# Patient Record
Sex: Female | Born: 1940 | Race: White | Hispanic: No | Marital: Married | Smoking: Current some day smoker
Health system: Southern US, Community
[De-identification: ages and names within clinical notes are randomized; demographics above are authoritative.]

## PROBLEM LIST (undated history)

## (undated) DIAGNOSIS — M1990 Unspecified osteoarthritis, unspecified site: Secondary | ICD-10-CM

## (undated) DIAGNOSIS — IMO0002 Reserved for concepts with insufficient information to code with codable children: Secondary | ICD-10-CM

## (undated) DIAGNOSIS — K589 Irritable bowel syndrome without diarrhea: Secondary | ICD-10-CM

## (undated) DIAGNOSIS — G2581 Restless legs syndrome: Secondary | ICD-10-CM

## (undated) DIAGNOSIS — J449 Chronic obstructive pulmonary disease, unspecified: Secondary | ICD-10-CM

## (undated) DIAGNOSIS — M797 Fibromyalgia: Secondary | ICD-10-CM

## (undated) DIAGNOSIS — M419 Scoliosis, unspecified: Secondary | ICD-10-CM

## (undated) DIAGNOSIS — I10 Essential (primary) hypertension: Secondary | ICD-10-CM

## (undated) HISTORY — PX: TOTAL SHOULDER REPLACEMENT: SUR1217

## (undated) HISTORY — DX: Essential (primary) hypertension: I10

## (undated) HISTORY — DX: Fibromyalgia: M79.7

## (undated) HISTORY — DX: Scoliosis, unspecified: M41.9

## (undated) HISTORY — PX: BACK SURGERY: SHX140

## (undated) HISTORY — DX: Chronic obstructive pulmonary disease, unspecified: J44.9

## (undated) HISTORY — DX: Unspecified osteoarthritis, unspecified site: M19.90

## (undated) HISTORY — DX: Irritable bowel syndrome without diarrhea: K58.9

## (undated) HISTORY — PX: CHOLECYSTECTOMY: SHX55

## (undated) HISTORY — DX: Restless legs syndrome: G25.81

## (undated) HISTORY — PX: REPLACEMENT TOTAL KNEE: SUR1224

## (undated) HISTORY — DX: Reserved for concepts with insufficient information to code with codable children: IMO0002

---

## 2003-07-08 ENCOUNTER — Ambulatory Visit (HOSPITAL_COMMUNITY): Admission: RE | Admit: 2003-07-08 | Discharge: 2003-07-08 | Payer: Self-pay | Admitting: Family Medicine

## 2003-09-02 ENCOUNTER — Ambulatory Visit (HOSPITAL_COMMUNITY): Admission: RE | Admit: 2003-09-02 | Discharge: 2003-09-02 | Payer: Self-pay | Admitting: Internal Medicine

## 2003-09-05 ENCOUNTER — Ambulatory Visit (HOSPITAL_COMMUNITY): Admission: RE | Admit: 2003-09-05 | Discharge: 2003-09-05 | Payer: Self-pay | Admitting: Internal Medicine

## 2003-10-22 ENCOUNTER — Ambulatory Visit (HOSPITAL_COMMUNITY): Admission: RE | Admit: 2003-10-22 | Discharge: 2003-10-22 | Payer: Self-pay | Admitting: Family Medicine

## 2004-10-26 ENCOUNTER — Emergency Department (HOSPITAL_COMMUNITY): Admission: EM | Admit: 2004-10-26 | Discharge: 2004-10-26 | Payer: Self-pay | Admitting: Emergency Medicine

## 2006-07-13 ENCOUNTER — Ambulatory Visit: Payer: Self-pay | Admitting: Orthopedic Surgery

## 2006-08-02 DIAGNOSIS — Z8679 Personal history of other diseases of the circulatory system: Secondary | ICD-10-CM | POA: Insufficient documentation

## 2006-08-16 ENCOUNTER — Inpatient Hospital Stay (HOSPITAL_COMMUNITY): Admission: RE | Admit: 2006-08-16 | Discharge: 2006-08-20 | Payer: Self-pay | Admitting: Orthopedic Surgery

## 2006-12-06 ENCOUNTER — Inpatient Hospital Stay (HOSPITAL_COMMUNITY): Admission: RE | Admit: 2006-12-06 | Discharge: 2006-12-08 | Payer: Self-pay | Admitting: Orthopedic Surgery

## 2007-06-14 ENCOUNTER — Encounter: Admission: RE | Admit: 2007-06-14 | Discharge: 2007-06-14 | Payer: Self-pay | Admitting: Orthopedic Surgery

## 2008-06-14 IMAGING — CR DG KNEE 1-2V PORT*L*
2 series · 2 of 2 positions shown · non-contrast
Comparison: Preoperative, 7667.

Left knee, 2 views.
CLINICAL DATA: Knee replacement.
TECHNIQUE: AP and lateral portable left knee radiographs.

[view not recorded (1 of 2)]
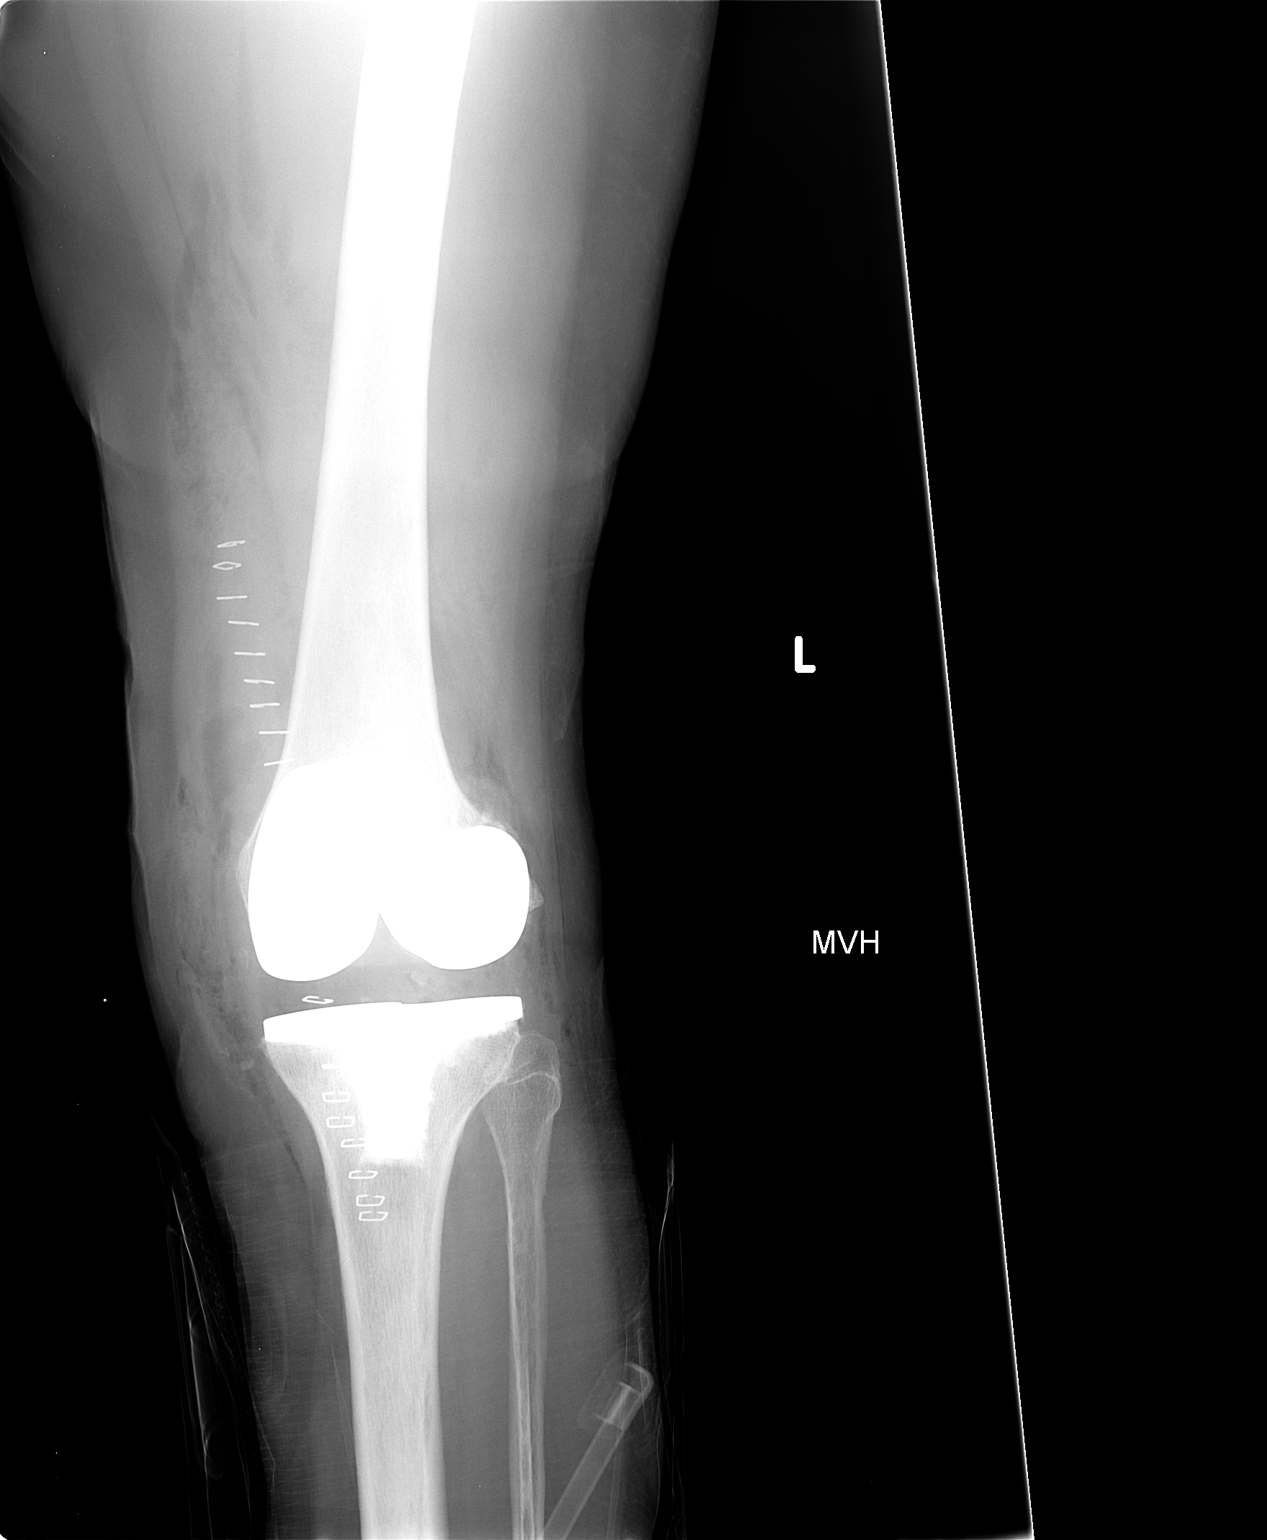

[view not recorded (2 of 2)]
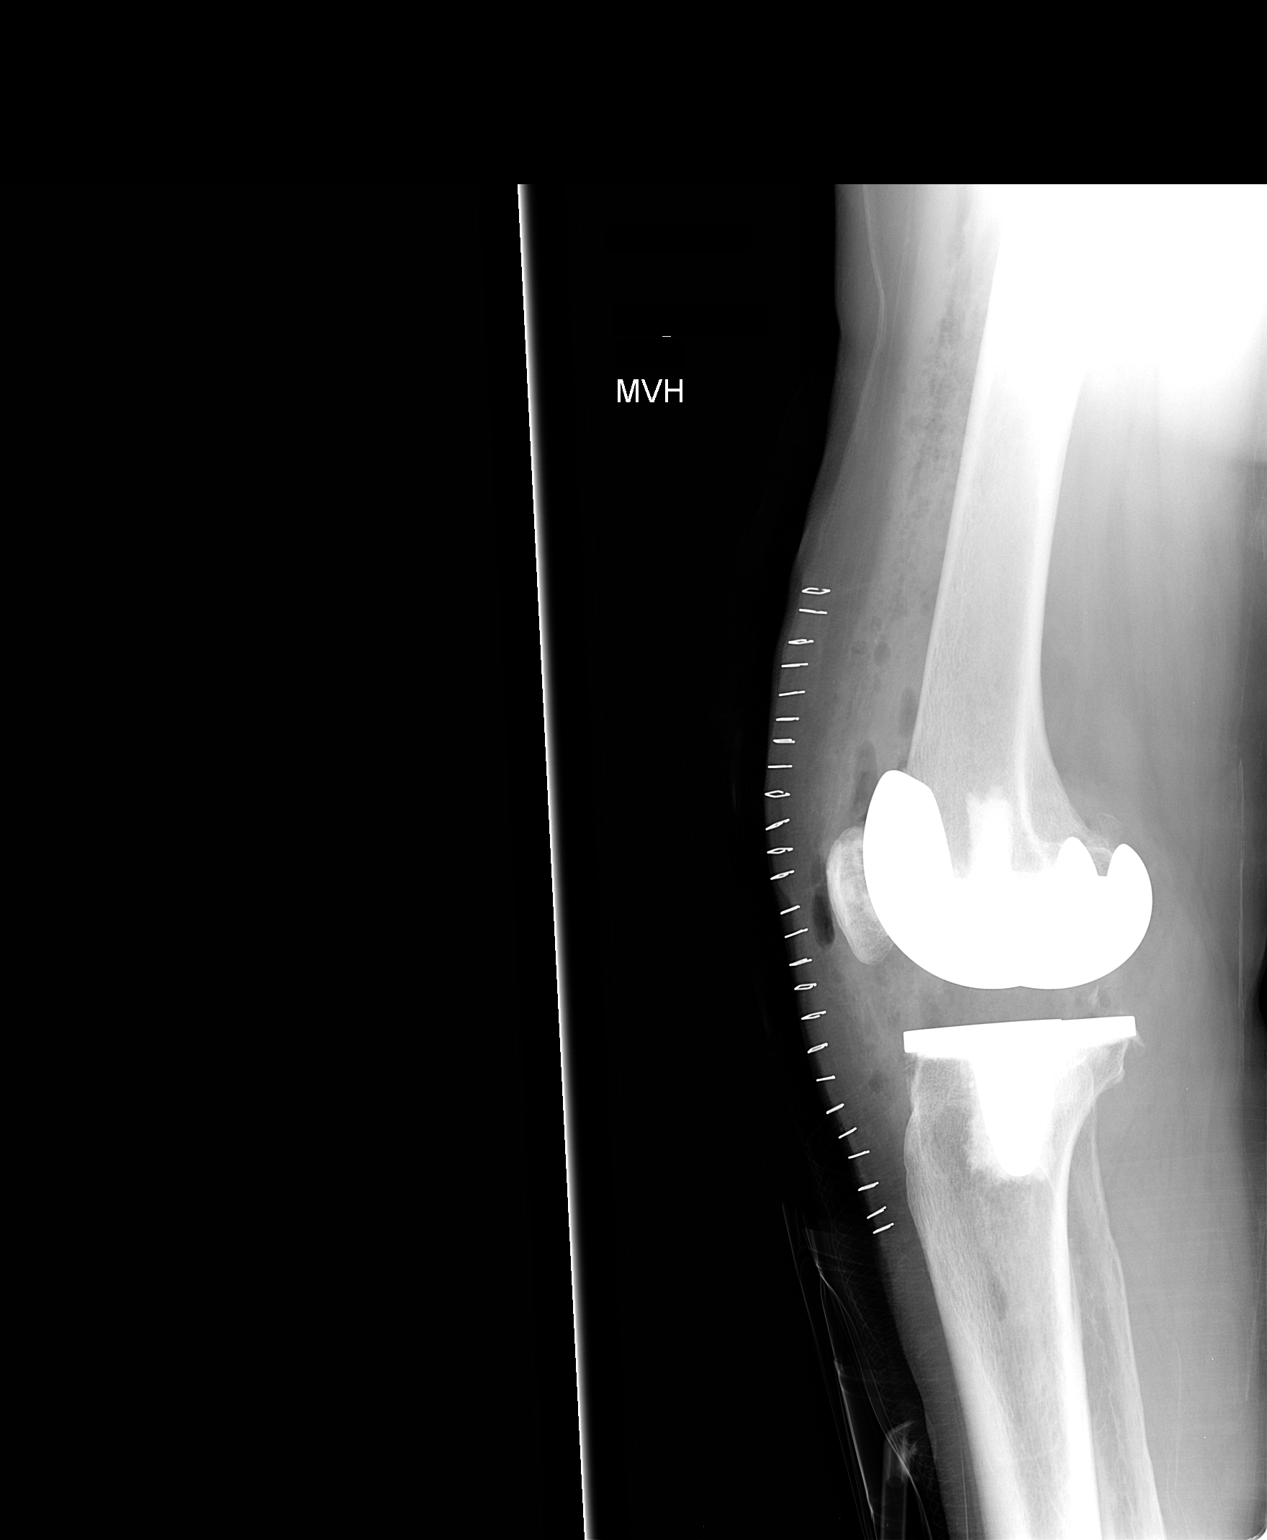

[2 of 2 positions shown; findings below may reference images not displayed]

FINDINGS: New three-part left total knee arthroplasty. No complication.
Anatomic alignment. Gas in soft tissues, as expected.
IMPRESSION: Uncomplicated new left three-part total knee arthroplasty.

## 2008-10-04 IMAGING — CR DG SHOULDER 2+V PORT*R*
1 series · 1 of 1 positions shown · non-contrast
Comparison: none

CLINICAL DATA: Right shoulder osteoarthritis - hemiarthroplasty. 
 PORTABLE RIGHT SHOULDER - 12/06/2006:

[view not recorded]
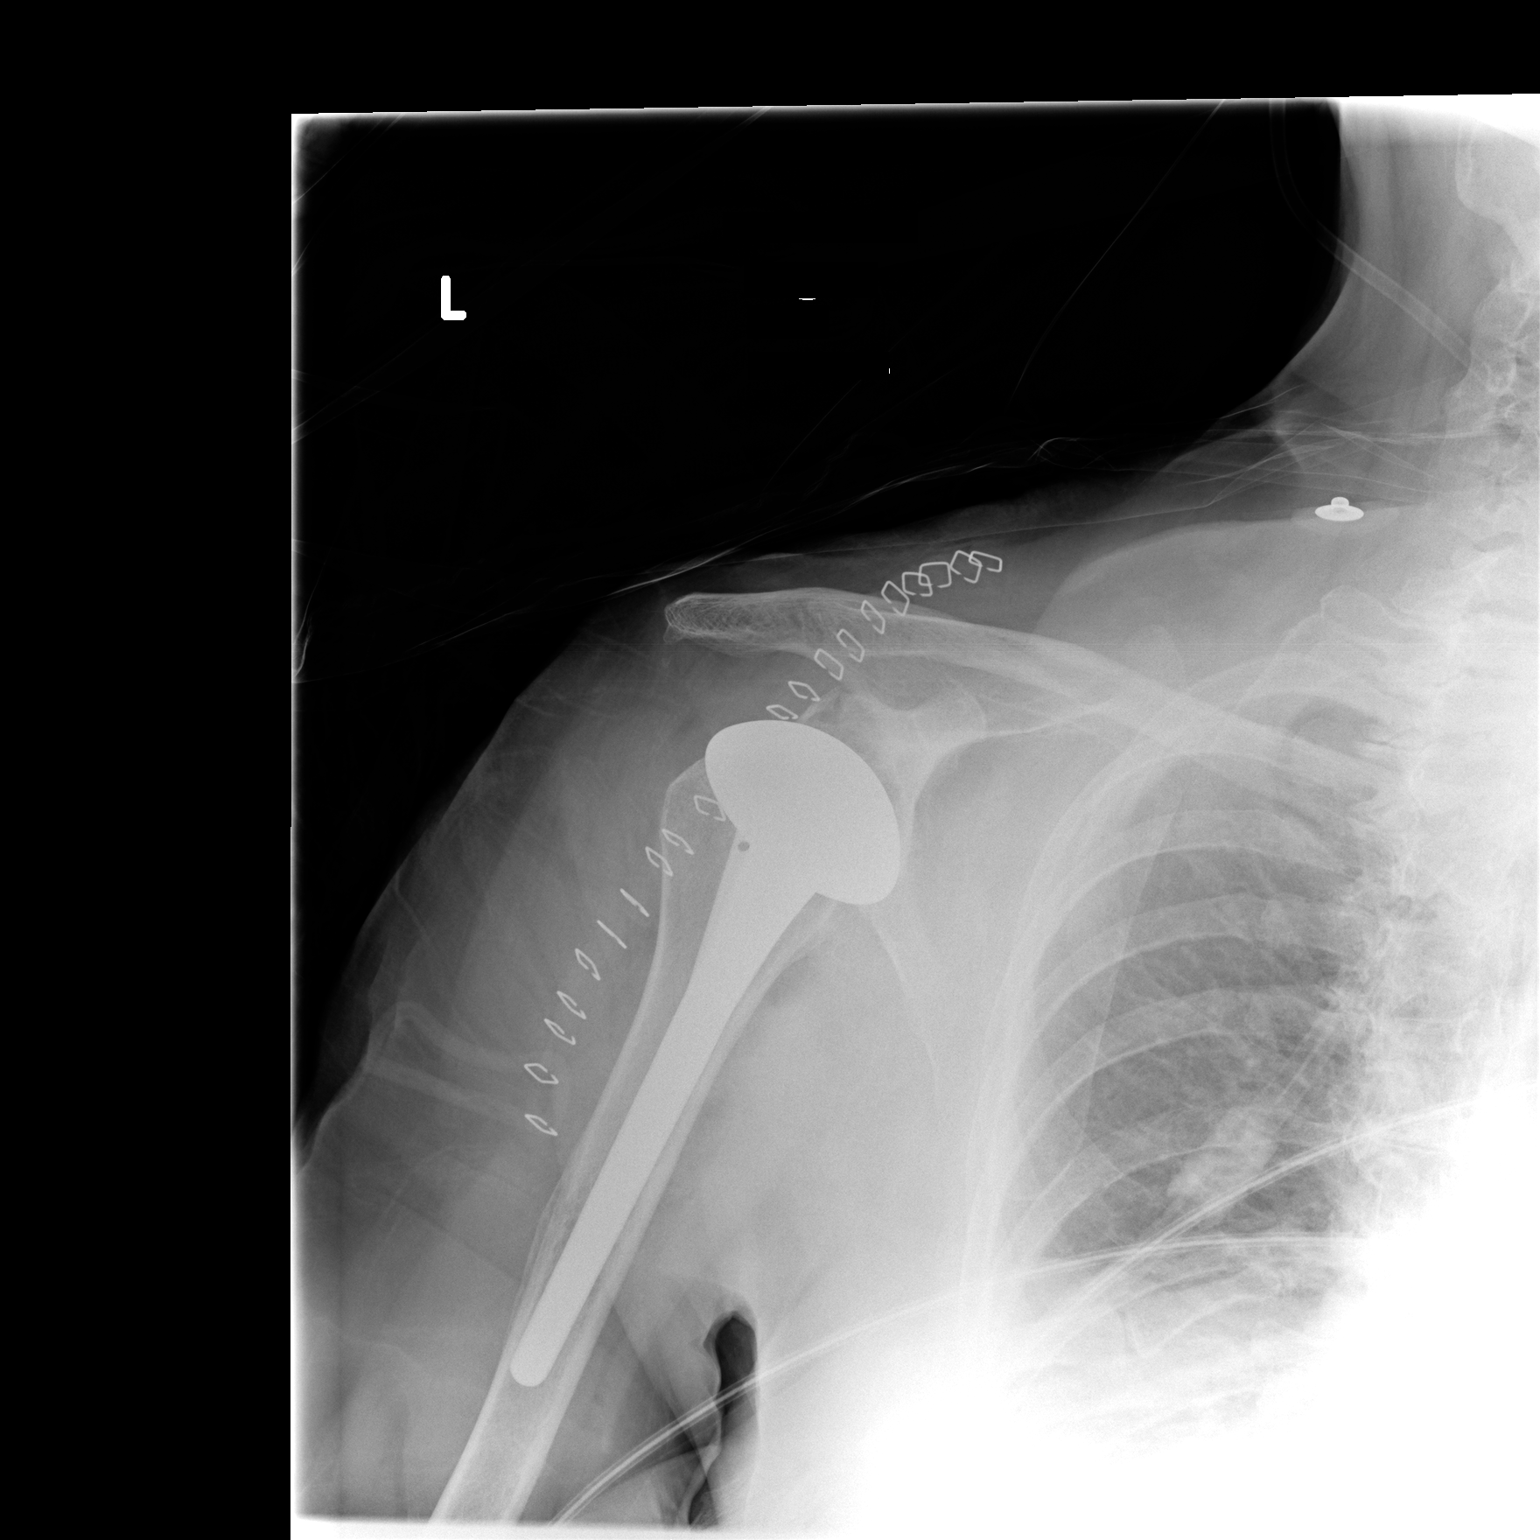

[1 of 1 positions shown; findings below may reference images not displayed]

FINDINGS: Proximal right humeral prosthesis/hemiarthroplasty is noted. No definite complicating features identified.  Post-operative changes in this area are noted.
IMPRESSION: Right shoulder hemiarthroplasty without complicating features.

## 2010-01-01 ENCOUNTER — Ambulatory Visit (HOSPITAL_COMMUNITY)
Admission: RE | Admit: 2010-01-01 | Discharge: 2010-01-01 | Payer: Self-pay | Source: Home / Self Care | Attending: Neurosurgery | Admitting: Neurosurgery

## 2010-02-26 ENCOUNTER — Other Ambulatory Visit: Payer: Self-pay | Admitting: Neurosurgery

## 2010-02-26 ENCOUNTER — Other Ambulatory Visit (HOSPITAL_COMMUNITY): Payer: Self-pay | Admitting: Neurosurgery

## 2010-02-26 ENCOUNTER — Ambulatory Visit (HOSPITAL_COMMUNITY)
Admission: RE | Admit: 2010-02-26 | Discharge: 2010-02-26 | Disposition: A | Discharge status: Home / Self Care | Payer: Medicare Other | Source: Ambulatory Visit | Attending: Neurosurgery | Admitting: Neurosurgery

## 2010-02-26 ENCOUNTER — Encounter (HOSPITAL_COMMUNITY)
Admission: RE | Admit: 2010-02-26 | Discharge: 2010-02-26 | Disposition: A | Discharge status: Home / Self Care | Payer: Medicare Other | Source: Ambulatory Visit | Attending: Neurosurgery | Admitting: Neurosurgery

## 2010-02-26 DIAGNOSIS — M47816 Spondylosis without myelopathy or radiculopathy, lumbar region: Secondary | ICD-10-CM

## 2010-02-26 DIAGNOSIS — J4489 Other specified chronic obstructive pulmonary disease: Secondary | ICD-10-CM | POA: Insufficient documentation

## 2010-02-26 DIAGNOSIS — Z01818 Encounter for other preprocedural examination: Secondary | ICD-10-CM | POA: Insufficient documentation

## 2010-02-26 DIAGNOSIS — J449 Chronic obstructive pulmonary disease, unspecified: Secondary | ICD-10-CM | POA: Insufficient documentation

## 2010-02-26 DIAGNOSIS — I517 Cardiomegaly: Secondary | ICD-10-CM | POA: Insufficient documentation

## 2010-03-04 ENCOUNTER — Inpatient Hospital Stay (HOSPITAL_COMMUNITY)
Admission: RE | Admit: 2010-03-04 | Discharge: 2010-03-09 | DRG: 458 | Disposition: A | Discharge status: Home / Self Care | Payer: Medicare Other | Source: Ambulatory Visit | Attending: Neurosurgery | Admitting: Neurosurgery

## 2010-03-04 ENCOUNTER — Inpatient Hospital Stay (HOSPITAL_COMMUNITY): Payer: Medicare Other

## 2010-03-04 DIAGNOSIS — Z96619 Presence of unspecified artificial shoulder joint: Secondary | ICD-10-CM

## 2010-03-04 DIAGNOSIS — F329 Major depressive disorder, single episode, unspecified: Secondary | ICD-10-CM | POA: Diagnosis present

## 2010-03-04 DIAGNOSIS — Z96659 Presence of unspecified artificial knee joint: Secondary | ICD-10-CM

## 2010-03-04 DIAGNOSIS — J449 Chronic obstructive pulmonary disease, unspecified: Secondary | ICD-10-CM | POA: Diagnosis present

## 2010-03-04 DIAGNOSIS — F17200 Nicotine dependence, unspecified, uncomplicated: Secondary | ICD-10-CM | POA: Diagnosis present

## 2010-03-04 DIAGNOSIS — M4120 Other idiopathic scoliosis, site unspecified: Principal | ICD-10-CM | POA: Diagnosis present

## 2010-03-04 DIAGNOSIS — R0902 Hypoxemia: Secondary | ICD-10-CM | POA: Diagnosis present

## 2010-03-04 DIAGNOSIS — Q762 Congenital spondylolisthesis: Secondary | ICD-10-CM

## 2010-03-04 DIAGNOSIS — M51379 Other intervertebral disc degeneration, lumbosacral region without mention of lumbar back pain or lower extremity pain: Secondary | ICD-10-CM | POA: Diagnosis present

## 2010-03-04 DIAGNOSIS — Z7982 Long term (current) use of aspirin: Secondary | ICD-10-CM

## 2010-03-04 DIAGNOSIS — J4489 Other specified chronic obstructive pulmonary disease: Secondary | ICD-10-CM | POA: Diagnosis present

## 2010-03-04 DIAGNOSIS — F3289 Other specified depressive episodes: Secondary | ICD-10-CM | POA: Diagnosis present

## 2010-03-04 DIAGNOSIS — G4700 Insomnia, unspecified: Secondary | ICD-10-CM | POA: Diagnosis present

## 2010-03-04 DIAGNOSIS — Z79899 Other long term (current) drug therapy: Secondary | ICD-10-CM

## 2010-03-04 DIAGNOSIS — M5137 Other intervertebral disc degeneration, lumbosacral region: Secondary | ICD-10-CM | POA: Diagnosis present

## 2010-03-04 LAB — TYPE AND SCREEN
ABO/RH(D): A POS
Antibody Screen: NEGATIVE

## 2010-03-05 LAB — GLUCOSE, CAPILLARY: Glucose-Capillary: 100 mg/dL — ABNORMAL HIGH (ref 70–99)

## 2010-03-09 ENCOUNTER — Encounter: Payer: Self-pay | Admitting: Pulmonary Disease

## 2010-03-09 DIAGNOSIS — R0902 Hypoxemia: Secondary | ICD-10-CM

## 2010-03-09 DIAGNOSIS — J449 Chronic obstructive pulmonary disease, unspecified: Secondary | ICD-10-CM

## 2010-03-10 NOTE — Consult Note (Signed)
NAMEJOLA, CRITZER                ACCOUNT NO.:  [id number]  MEDICAL RECORD NO.:  [id number]           PATIENT TYPE:  I  LOCATION:  3040                         FACILITY:  [facility]  PHYSICIAN:  Oretha Milch, MD      DATE OF BIRTH:  1940/06/04  DATE OF CONSULTATION:  03/09/2010 DATE OF DISCHARGE:  03/09/2010                                CONSULTATION   PRIMARY CARE PHYSICIAN:  Doreen Beam, MD  REFERRING PHYSICIAN:  Danae Orleans. Venetia Maxon, MD  REASON FOR CONSULTATION:  Hypoxia.  HISTORY OF PRESENT ILLNESS:  Ms. Spainhower is a pleasant 70 year old smoker who underwent lumbar decompression and fusion with bone graft for severe scoliosis and left leg pain/sciatica on March 04, 2010, by Dr. Venetia Maxon. She seems to have recovered well postoperatively and is ambulating with a walker.  She was noted by Physical Therapy to de-sat to as low as 84% on ambulation.  Sats on room air have been recorded from 91-93%.  The patient denies any dyspnea, cough, wheezing, and feels that she is at her baseline and ready to go home.  She reports an admission to [facility] a year ago for pneumonia when she was discharged on oxygen, but since she did not use it, she had it picked up after a few weeks.  PAST MEDICAL HISTORY: 1. Degenerative arthritis. 2. Depression. 3. Insomnia.  PAST SURGICAL HISTORY: 1. Left knee replacement. 2. Bilateral shoulder replacement. 3. Right rotator cuff repair. 4. Recent back surgery.  MEDICATIONS AT HOME: 1. Cymbalta 60 mg p.o. daily. 2. Chlorthalidone 25 p.o. daily for hypertension. 3. Albuterol MDI 2 puffs q.4 h. as needed. 4. Aspirin 81 mg p.o. daily. 5. Vitamins. 6. Hydroxyzine 25 mg p.o. nightly. 7. Temazepam 15 mg p.o. nightly. 8. Lyrica 50 mg p.o. b.i.d. 9. Potassium chloride 20 mEq p.o. daily. 10.Oxycodone/acetaminophen. 11.Gabapentin 800 mg p.o. 3 times a day. 12.Ibuprofen 600 mg t.i.d. p.r.n.  SOCIAL HISTORY:  She lives with her husband in [place].  She is  currently disabled, lives in a 1-story house.  She smokes slightly less than a pack per day.  She started smoking at the age of 55.  No history of alcohol use.  FAMILY HISTORY:  Father died from heart disease.  Mother is deceased.  REVIEW OF SYSTEMS:  Denies chest pain, dyspnea, palpitations.  No history of leg pain.  No claudication.  Denies nausea, vomiting, diarrhea.  No dizziness, syncope.  No history of excessive daytime somnolence.  Reports insomnia.  Review of other systems were negative.  Chest x-ray for February 26, 2010, shows mild cardiomegaly and hyperinflation consistent with emphysema, no suggestion of pulmonary fibrosis.  LABORATORY DATA:  WBC count 11, hemoglobin 13.1, platelets 309.  BUN and creatinine 9/0.8 from February 26, 2010.  IMPRESSION: 1. Chronic obstructive pulmonary disease. 2. Hypoxia on exertion. 3. Tobacco abuse.  1. I had a discussion with the patient regarding smoking cessation and     hypoxia due to COPD.  I feel this is not any worse from her     baseline, but probably picked up during this admission since it was  being checked during ambulation.  She is not agreeable to using     home oxygen at least for a short term. 2. We will set her up with 2 liters of home oxygen during exertion and     during sleep. 3. Smoking cessation was discussed in detail.  She will start Chantix     starter pack.  I discussed side effects including vivid dreams and     mild nausea for a few days. 4. Albuterol MDI 2 puffs 3 times a day and will be used as needed.     Spiriva can be started as outpatient.  Lung function will be     obtained as outpatient to further clarify.  Due to early     mobilization and short duration of immobilization, I feel the     clinical probability of PE is low here and there does seem to be an     alternative explanation available.  A followup appointment was made     for Ms. Gerber in about 10 days' time.     Oretha Milch,  MD     RVA/MEDQ  D:  03/09/2010  T:  03/10/2010  Job:  [id number]  cc:   Doreen Beam, MD Danae Orleans. Venetia Maxon, M.D.  Electronically Signed by Cyril Mourning MD on 03/10/2010 05:26:52 PM

## 2010-03-15 LAB — CBC
MCH: 30 pg (ref 26.0–34.0)
MCHC: 33.3 g/dL (ref 30.0–36.0)
MCV: 90.1 fL (ref 78.0–100.0)
Platelets: 309 10*3/uL (ref 150–400)
RDW: 13.7 % (ref 11.5–15.5)

## 2010-03-15 LAB — BASIC METABOLIC PANEL
BUN: 9 mg/dL (ref 6–23)
CO2: 30 mEq/L (ref 19–32)
Chloride: 97 mEq/L (ref 96–112)
Creatinine, Ser: 0.84 mg/dL (ref 0.4–1.2)
Glucose, Bld: 86 mg/dL (ref 70–99)

## 2010-03-15 LAB — TYPE AND SCREEN: Unit division: 0

## 2010-03-15 LAB — ABO/RH: ABO/RH(D): A POS

## 2010-03-15 LAB — SURGICAL PCR SCREEN: MRSA, PCR: POSITIVE — AB

## 2010-03-19 DIAGNOSIS — F329 Major depressive disorder, single episode, unspecified: Secondary | ICD-10-CM | POA: Insufficient documentation

## 2010-03-19 DIAGNOSIS — M1990 Unspecified osteoarthritis, unspecified site: Secondary | ICD-10-CM | POA: Insufficient documentation

## 2010-03-19 DIAGNOSIS — G4700 Insomnia, unspecified: Secondary | ICD-10-CM | POA: Insufficient documentation

## 2010-03-19 NOTE — Op Note (Signed)
Sheila [name], BRUINGTON                ACCOUNT NO.:  [id number]  MEDICAL RECORD NO.:  [id number]           PATIENT TYPE:  I  LOCATION:  3040                         FACILITY:  [facility]  PHYSICIAN:  Danae Orleans. Venetia Maxon, M.D.  DATE OF BIRTH:  1940/03/07  DATE OF PROCEDURE:  03/04/2010 DATE OF DISCHARGE:                              OPERATIVE REPORT   PREOPERATIVE DIAGNOSES: 1. Scoliosis. 2. Spondylolisthesis. 3. Stenosis. 4. Degenerative disk disease. 5. Radiculopathy L4-5 level.  POSTOPERATIVE DIAGNOSES: 1. Scoliosis. 2. Spondylolisthesis. 3. Stenosis. 4. Degenerative disk disease. 5. Radiculopathy L4-5 level.  PROCEDURES:  L4-5 anterolateral decompression and fusion with PEEK interbody cages, allograft bone graft, and pedicle screw fixation, L4-L5 levels.  SURGEON:  Danae Orleans. Venetia Maxon, MD  ASSISTANT:  Georgiann Cocker, RN and Cristi Loron, MD  ANESTHESIA:  General endotracheal anesthesia.  ESTIMATED BLOOD LOSS:  Less than 100 mL.  COMPLICATIONS:  None.  DISPOSITION:  Recovery.  INDICATIONS:  Sheila [name] is a 70 year old woman with severe scoliosis and lateral listhesis as long as anterolisthesis of L4 and L5 with marked foraminal and extraforaminal stenosis at L4-5 on the left.  She has left leg pain.  It was elected to perform an anterolateral decompression and fusion with PEEK at this level.  PROCEDURE:  Ms. Sokolow was brought to the operating room.  Following a satisfactory and uncomplicated induction of general endotracheal anesthesia and placement of intravenous lines and Foley catheter, the patient was placed in the left lateral decubitus position with an axillary roll.  She was then positioned with orthogonal alignment of the L4-5 interspace using AP and lateral fluoroscopy.  Neural monitoring was performed with electrodes and the right side was marked overlying the L4- 5 interspace after the table was broken and aligned properly.  The area of planned incision was  infiltrated with local lidocaine.  Posterior finger dissection incision was then made.  An incision was then made using a marker probe, the psoas muscle was then entered and twitch test was performed.  Subsequently, the probe was docked on the L4-5 interspace 40% anterior from the posterior aspect of the L5 vertebra. The stimulation mapping was then performed, there was no evidence of any proximity of neural structures.  These sequential dilators were then placed and the fixed retractor was then placed.  Again there was no evidence of any proximity to the neural structures.  The shim was then docked after its position was confirmed on AP and lateral fluoroscopy and then entered within the L4-5 interspace.  These stimulation mapping again demonstrated no proximity to the lumbar plexus.  The interspace was then incised and thorough diskectomy was performed.  Endplates were prepared using Cobb curettes, box cutting curettes, and sequential dilators.  Eventually an 18 x 10 lordotic trial spacer was placed and this was felt to be well-positioned with good restoration of scoliosis and spondylolisthesis.  Subsequently, a 50 x 18 x 10 lordotic cage was packed with 5 mL of Osteocel plus, this was then inserted and countersunk appropriately.  Its position was confirmed on AP and lateral fluoroscopy.  The self-retaining retractor was then removed.  The soft  tissues were inspected and found to be in good repair.  The lumbodorsal fascia was closed with 0-Vicryl sutures, subcutaneous tissues were approximated with 2-0 Vicryl interrupted inverted sutures, and skin edges were reapproximated 3-0 Vicryl subcuticular stitch.  The wounds were dressed with Dermabond.  The patient was then turned to a prone position on the operating table on chest rolls and using AP and lateral fluoroscopy again percutaneous pedicle screws were placed using 6.5 x 45 mm screws at L4 and L5 bilaterally and 45 mm rods.  The screws  were locked down in situ.  There was no evidence of any cutouts, the wounds were then irrigated, closed with 1, 2-0, and 3-0 Vicryl sutures, and sterile occlusive dressing was placed with Dermabond.  The patient was then extubated in the operating room, taken to the recovery in stable and satisfactory condition having tolerated the operation well.  Counts were correct at the end of the case.     Danae Orleans. Venetia Maxon, M.D.     JDS/MEDQ  D:  03/04/2010  T:  03/04/2010  Job:  [id number]  Electronically Signed by Maeola Harman M.D. on 03/19/2010 12:16:08 PM

## 2010-03-22 ENCOUNTER — Inpatient Hospital Stay: Payer: Medicare Other | Admitting: Pulmonary Disease

## 2010-03-23 ENCOUNTER — Telehealth: Payer: Self-pay | Admitting: Pulmonary Disease

## 2010-03-23 NOTE — Discharge Summary (Signed)
  [name], [name]                ACCOUNT NO.:  [id number]  MEDICAL RECORD NO.:  [id number]           PATIENT TYPE:  I  LOCATION:  3040                         FACILITY:  [facility]  PHYSICIAN:  Sheila [name], M.D.  DATE OF BIRTH:  Feb 14, 1940  DATE OF ADMISSION:  03/04/2010 DATE OF DISCHARGE:  03/09/2010                              DISCHARGE SUMMARY   REASON FOR ADMISSION:  Lumbar scoliosis, spondylolisthesis, stenosis, degenerative disk disease, and lumbar radiculopathy at L4-5 level.  POSTOPERATIVE DIAGNOSES:  Lumbar scoliosis, spondylolisthesis, stenosis, degenerative disk disease, and lumbar radiculopathy at L4-5 level.  HISTORY OF PRESENT ILLNESS:  Sheila [name] is a 69 year old woman with severe scoliosis and lateral listhesis along with anterolisthesis at L4- L5 with marked foraminal and extraforaminal stenosis at the L4-5 level on the left, was elected to admit her for anterolateral decompression and fusion and this was done from the right with interbody graft at the L4-5 level with adequate re-repair of lateral listhesis and disk space degeneration and spondylolisthesis.  The patient also underwent percutaneous pedicle screw fixation L4-L5 levels at the same time. Postoperatively, the patient had good strength in her lower extremities. She was gradually mobilized.  She used a three-in-one device.  She was doing well on the 28, but did have desaturation, off oxygen, and it was therefore elected for Dr. Vassie Loll from Pulmonary critical care service to see her, and was felt that given her significant smoking history as well as desaturating down to 91% on rest on room air, it was felt that she needed to be discharged home with home oxygen.  This was then arranged and the patient was discharged home with preoperative medications of Cymbalta 60 mg daily, chlorthalidone 25 mg daily, albuterol inhaler 2 puffs every 4 hours as needed, aspirin 81 mg daily, vitamin B12 over-the- counter  once daily, vitamin D3 1000 units 4 tablets daily, vitamin E 1 capsule daily, vitamin C 1 tablet twice daily, fish oil was held, hydroxyzine 25 mg daily at bedtime, temazepam 15 mg daily, Lyrica 50 mg twice daily, potassium chloride 20 mEq once daily with oxycodone 7.5/325 as needed for back pain.  She is instructed to wear a back brace when up gradually mobilized.  To follow up in my office in 3 weeks postoperatively with repeat radiographs.     Sheila [name], M.D.     JDS/MEDQ  D:  03/19/2010  T:  03/20/2010  Job:  [id number]  Electronically Signed by Sheila [name] M.D. on 03/23/2010 07:33:32 AM

## 2010-03-25 ENCOUNTER — Inpatient Hospital Stay (HOSPITAL_COMMUNITY): Admission: RE | Admit: 2010-03-25 | Payer: Medicare Other | Source: Ambulatory Visit | Admitting: Thoracic Surgery

## 2010-03-30 NOTE — Progress Notes (Signed)
Summary: nos apt  Phone Note Call from Patient   Caller: [email or web address]  Call For: Jolyne Laye Summary of Call: In ref to nos from 3/12 pt states she doesn't want to rsc.  Initial call taken by: Darletta Moll,  March 23, 2010 9:24 AM

## 2010-03-30 NOTE — Consult Note (Signed)
Summary: [place]  [place]   Imported By: Lennie Odor 03/25/2010 12:10:52  _____________________________________________________________________  External Attachment:    Type:   Image     Comment:   External Document

## 2010-05-25 NOTE — Op Note (Signed)
Sheila [name], Sheila [name]                ACCOUNT NO.:  [id number]   MEDICAL RECORD NO.:  [id number]          PATIENT TYPE:  INP   LOCATION:  X003                         FACILITY:  [facility]   PHYSICIAN:  Georges Lynch. Gioffre, M.D.DATE OF BIRTH:  [birth year]   DATE OF PROCEDURE:  08/16/2006  DATE OF DISCHARGE:                               OPERATIVE REPORT   SURGEON:  Georges Lynch. Darrelyn Hillock, M.D.   ASSISTANT:  Jamelle Rushing, P.A.   PREOPERATIVE DIAGNOSIS:  Degenerative arthritis of the left knee with a  genu varus deformity.   POSTOPERATIVE DIAGNOSIS:  Degenerative arthritis of the left knee with a  genu varus deformity.   OPERATION:  Left total knee arthroplasty utilizing the [employer] system.  I utilized vancomycin in the cement.  The sizes of the prosthesis:  The  patella was a size 30-35 mm patella.  The femoral component was a size 3  left posterior cruciate sacrificing prosthesis.  Tibial tray was a size  3 tibial tray.  The insert was a size 3, 12.5 mm thickness.   DESCRIPTION OF PROCEDURE:  Under general anesthesia, routine orthopedic  prep and draping of the left lower extremity was carried out.  Leg was  exsanguinated with Esmarch and a tourniquet was elevated 325 mmHg.  An  incision was made over the anterior aspect of the left knee with the  knee flexed.  Bleeders were identified and cauterized.  We then extended  the knee and then carried out a median parapatellar incision.  The  patella was reflected laterally and the knee was flexed.  I did medial  and lateral meniscectomy and excised the anterior and posterior cruciate  ligaments and I did a synovectomy.  All spurs were removed as well with  a rongeur.  We then made initial drill hole in the intercondylar notch.  The intramedullary guide rod was inserted after we irrigated out the  femoral canal.  We then removed 10 mm thickness off the distal femur.  Following that, we inserted our #2 jig and measurements were taken for a  size  3 femoral component.  Once this was carried out, we then made our  anterior, posterior and chamfer cuts with our next jig.  So the femur  was a size 3 left.  Following that, we then prepared the tibia in the  usual fashion.  We removed the appropriate amount of bone utilizing the  guide on the medial aspect of the tibia.  The tibial plateau cut was  made.  We then cut our keel cut in the tibia.  We then examined the  posterior condyles of the femur, made sure all loose pieces of bone  flap, we had a large loose body removed and we made sure that we had  appropriate amount of spurs removed as well.  We then cut our notch cut  out of the femur in the usual fashion.  We then went through our trials,  inserted our trial prosthesis, had good function, but we finally  selected a 12.5 mm thickness tibial insert.  We then measured patella to  be  a size 35 patella.  We removed the appropriate amount of bone from  the articular surface of patella for a resurfacing patella.  Three drill  holes were made in the patella for a size 35 mm patella.  We then  inserted trial patella and it fit quite nicely.  Following that, we  thoroughly irrigated out the area, removed all the trial components,  water picked the knee out, dried the knee out.  We inserted Gelfoam into  the femoral canal and down in the tibial canal.  We then inserted our  cement which had vancomycin in it and affixed all three components in  simultaneously.  All loose pieces of cement were removed.  Note that we  did a meticulous search for cement to make sure there were no loose  pieces posteriorly as well.  We thoroughly waterpiked out the knee.  We  went through trials again and finally selected a 12.5 mm thickness  insert.  Prior to inserting our tibial rotating platform, we injected  about 30 mL those of 0.25% Marcaine with Toradol and epinephrine into  the soft tissue structures.  We then let the tourniquet down.  We then  inserted  our  FloSeal and then inserted our permanent rotating platform tibial insert,  reduced the knee and had excellent stability and excellent motion.  We  elected not to use a drain at this time.  We closed the wound layers in  usual fashion.  Sterile dressings were applied.           ______________________________  Georges Lynch Darrelyn Hillock, M.D.     RAG/MEDQ  D:  08/16/2006  T:  08/16/2006  Job:  [id number]

## 2010-05-25 NOTE — H&P (Signed)
Sheila [name], Sheila [name]                ACCOUNT NO.:  [id number]   MEDICAL RECORD NO.:  [id number]          PATIENT TYPE:  INP   LOCATION:  NA                           FACILITY:  [facility]   PHYSICIAN:  Georges Lynch. Gioffre, M.D.DATE OF BIRTH:  1940/04/05   DATE OF ADMISSION:  DATE OF DISCHARGE:                              HISTORY & PHYSICAL   CHIEF COMPLAINT:  Painful range of motion bilateral shoulders, right  worse than left.   HISTORY OF PRESENT ILLNESS:  The patient is a 70 year old female here  today for her preoperative evaluation.  She has had severe pain with  attempts at range of motion and loss of range of motion of both  shoulders.  Right is significantly worse than the left.  Evaluation with  x-rays and MRI shows that she has severe obstructive arthritic changes  of the glenohumeral joint of the right shoulder.  The patient has  elected to proceed with a right shoulder hemiarthroplasty due to severe  pain.   ALLERGIES:  NO KNOWN DRUG ALLERGIES.   CURRENT MEDICATIONS:  1. Percocet 10/650 1 tablet every 4-6 hours.  2. Medrol Dosepak started today, 6-day course.  3. Chlorthalidone 25 mg a day.  4. Indocin 50 mg 1 tablet twice a day.  5. Neurontin 300 mg 2 tablets twice a day.  6. Potassium.  7. Meclizine p.r.n.   PAST MEDICAL HISTORY:  1. Recent left total knee arthroplasty.  2. Chronic pain.  3. COPD with tobacco use.  The patient is less than one pack a day.  4. Hypertension.  5. Vertigo.   PAST SURGICAL HISTORY:  1. Right shoulder rotator cuff repair.  2. Left knee arthroscopy.  3. Left total knee arthroplasty.   The patient denies any complications to the above-mentioned surgical  procedures.   PRIMARY CARE PHYSICIAN:  Doreen Beam, M.D., [place], [place].   REVIEW OF SYSTEMS:  Negative for neurologic, cardiovascular, GI, GU,  hematologic, or other endocrine issues at this time.   SOCIAL HISTORY:  The patient is married and lives with her husband.  She  is  currently disabled and lives in a [place] house.  She smokes less  than a pack a day.  Denies any alcohol use.   FAMILY HISTORY:  Father is deceased from heart disease.  Mother is  deceased from cancer.   PHYSICAL EXAMINATION:  VITAL SIGNS:  Height is 5 feet 5 inches, weight  is 165 pounds.  Blood pressure is 132/78, pulse of 70 and regular,  respirations 12.  She is afebrile.  GENERAL:  This is a healthy-appearing female slightly worn out-appearing  today.  She is conscious, alert, and appropriate.  HEENT:  Head was normocephalic.  Pupils equal, round, and reactive.  She  has no hearing loss.  NECK:  Supple.  Good range of motion.  CHEST:  Kyphotic-appearing posterior thoracic region.  LUNGS:  Lung sounds were clear and equal.  HEART:  Regular rate and rhythm.  ABDOMEN:  Soft, nontender.  EXTREMITIES:  The patient has loss of range of motion of both shoulders.  Left shoulder:  She  is only able to abduct about 45 and extend it up to  about 50 degrees with minimal internal-external rotation.  Left  shoulder:  She is able to extend it and abduct it to about 50-60 degrees  with limited internal-external rotation.  Lower extremities: Right and  left hip had full range of motion.  Both knees had full extension,  flexion back to 110 degrees on the left, 120 degrees on the right.  Calfs were soft and nontender.  Good range of motion of both ankles.  PERIPHERAL VASCULAR:  Carotid pulses were 2+, no bruits.  Radial pulses  were 2+.  She did have some lower extremity varicosities.  NEUROLOGIC:  The patient was conscious, alert, and appropriate.  No  gross neurologic defects noted.  BREAST, RECTAL, AND GU:  Deferred.   IMPRESSION:  1. End-stage osteoarthritis with severe pain and loss of range of      motion, right shoulder.  2. Chronic obstructive pulmonary disease with current tobacco use.  3. Hypertension.  4. Vertigo.  5. History of chronic pain medication use.   PLAN:  The patient  will undergo all routine labs and tests prior to  having a right shoulder hemiarthroplasty by Dr. Darrelyn Hillock at [facility] scheduled for December 06, 2006.      Jamelle Rushing, P.A.    ______________________________  Georges Lynch Darrelyn Hillock, M.D.    RWK/MEDQ  D:  11/27/2006  T:  11/27/2006  Job:  [id number]   cc:   Windy Fast A. Darrelyn Hillock, M.D.  Fax: [phone]

## 2010-05-25 NOTE — Consult Note (Signed)
NAMESHRESHTA, MEDLEY                ACCOUNT NO.:  [id number]   MEDICAL RECORD NO.:  [id number]          PATIENT TYPE:  INP   LOCATION:  X006                         FACILITY:  [facility]   PHYSICIAN:  Georges Lynch. Gioffre, M.D.DATE OF BIRTH:  05-Oct-1940   DATE OF CONSULTATION:  12/06/2006  DATE OF DISCHARGE:                                 CONSULTATION   SURGEON:  Georges Lynch. Darrelyn Hillock, M.D.   ASSISTANT:  Jamelle Rushing, P.A.   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  She has lost quite a bit of  motion in the shoulder over the years.   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  She has lost quite a bit of  motion in the shoulder over the years.   OPERATION:  Hemiarthroplasty, right shoulder utilizing the Global DePuy  system.  The sizes used:  The stem was a porous-coated stem size 10.  The head size measured 52 x 18, and there was an eccentric humeral head.   PROCEDURE:  Under general anesthesia, routine orthopedic prep and  draping of the right shoulder was carried out.  At this time the patient  was given 1 g of IV Ancef.  The patient was placed in the beach chair,  which gave [place] the ability to extend the shoulder and move the shoulder  freely.  An incision was made over the anterior aspect of the right  shoulder starting at the coracoid and advancing the incision distally  along the deltopectoral groove.  We identified the cephalic vein,  cauterized portions of the brain that were bleeding.  We had several  areas we had to cauterize.  Great care was taken medially to watch the  axillary and musculocutaneous nerves.  We identified the coracoid, then  we identified the conjoined tendon, gently retracted the area.  We  externally rotated the shoulder with an retractor up under the deltoid.  Following that we went down and identified the pectoralis major  and I  partially incised that because of the severe contracture.  Note, just  prior to the surgery I did a gentle closed manipulation of the shoulder.  I then went down and tagged the subscapularis and then split  subscapularis leaving a portion of the tendon laterally and we took the  remaining part of the tendon with the capsule medially.  I then went  down and dislocated the humeral head and it was severely arthritic and  collapsed.  I removed all the spurs with a rongeur.  I then utilized the  oscillating saw after applying the humeral guide to the humerus and  amputated the humeral head.  We then carried out our reaming up to a  size 10 and then we rasped up to a size 10.  Great care was taken to  make sure we had the prosthesis in the appropriate amount of the  retroversion.  We lined the posterior fin of the prosthesis with the  bicipital groove.  We then irrigated the area out and  inserted our  permanent humeral stem.  We utilized the porous-coated stem.  We had an  excellent tight.  I could not rotate the prosthesis with the handle on  the prosthesis.  Following that I examined the glenoid.  There were no  loose bodies.  We thoroughly cleaned out the area.  We then went through  trials and selected an 18-mm head and length 52-mm diameter eccentric  head.  First prior to the inserting the permanent prosthesis, we did go  through trials and that is why we selected the above mentioned.  We then  inserted our permanent humeral head with the appropriate eccentricity  placed posteriorly.  Following that we reduce the prosthesis, had good  motion and good stability.  We then placed the arm in internal rotation  and sutured  back the rotator portion of the superior portion of the rotator cuff and  the subscapularis.  We then irrigated the area out, loosely applied some  thrombin-soaked Gelfoam and close the subcu in the usual fashion and the  skin with metal staples.  A sterile Neosporin  dressing was applied and  the patient was then placed in a shoulder immobilizer.           ______________________________  Georges Lynch Darrelyn Hillock, M.D.     RAG/MEDQ  D:  12/06/2006  T:  12/06/2006  Job:  [id number]

## 2010-05-25 NOTE — H&P (Signed)
Sheila [name], [name]                ACCOUNT NO.:  [id number]   MEDICAL RECORD NO.:  [id number]          PATIENT TYPE:  INP   LOCATION:  NA                           FACILITY:  [facility]   PHYSICIAN:  Georges Lynch. Gioffre, M.D.DATE OF BIRTH:  30-Jan-1940   DATE OF ADMISSION:  08/16/2006  DATE OF DISCHARGE:                              HISTORY & PHYSICAL   CHIEF COMPLAINT:  Left knee pain.   HISTORY OF PRESENT ILLNESS:  The patient is a 70 year old female here  today for preadmission history and physical for her upcoming left total  knee arthroplasty by Dr. Darrelyn Hillock.  The patient has been having some  severe chronic pain in her knee with any types of range of motion and  weightbearing activities.  She has failed conservative treatment with  all narcotics and she is currently using morphine for the discomfort.  X-  rays reveal that she has bone on bone throughout the medial compartment  and would like to proceed with a total knee arthroplasty.   ALLERGIES:  NO KNOWN DRUG ALLERGIES.   CURRENT MEDICATIONS:  1. Chlorthalidone 25 mg a day.  2. Morphine sulfate 30 mg a day.  3. Indocin.  4. Neurontin 300 mg nightly.  5. Potassium.   PAST MEDICAL HISTORY:  1. Pain management through [facility] for morphine due to left lower      extremity pain.  2. 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  3. Hypertension.   PAST SURGICAL HISTORY:  1. Shoulder rotator cuff repair.  2. Knee arthroscopy on the left.   The patient denies any complications to the above-mentioned surgical  procedures.   PRIMARY CARE PHYSICIAN:  1. Primary care physician was Dr. Eliberto Ivory at [facility] (he      is deceased) and she has seen Dr. Sherril Croon since.  2. Pain management through a pain management clinic in [place].   REVIEW OF SYSTEMS:  Negative for any neurologic, cardiovascular,  gastrointestinal, GU, hematologic or endocrine issues at this present  time.   SOCIAL HISTORY:  Patient is married.  She is  currently disabled, lives  with her husband in a 1-story house.  She stopped smoking 1 month  previous.  She denies any alcohol use.  She has 3 grown children.   FAMILY MEDICAL HISTORY:  Father is deceased from heart disease.  Mother  is deceased from abdominal cancer.   PHYSICAL EXAM:  VITALS:  The patient's height is 5 feet 5 inches.  Weight is 164 pounds.  Blood pressure is 138/82, pulse of 74 and  regular, respirations are 12 and nonlabored, and the patient is  afebrile.  GENERAL:  This is a healthy-appearing female, conscious, alert and  appropriate, who appears to be a good historian.  She appears to be in  no extreme distress, but she does have significant left-sided limp.  HEENT:  Head was normocephalic.  Pupils were equal, round and reactive.  She did have dentures in place.  Buccal mucosa was pink and moist.  Gross hearing is intact.  NECK:  Supple.  No palpable lymphadenopathy.  Thyroid region was  nontender.  She had good motion of her cervical spine.  CHEST:  She had significant kyphotic structures.  Lung sounds were short  on inspiration, long on expiration, but clear today.  HEART:  Regular rate and rhythm, no murmurs, rubs or gallops.  ABDOMEN:  Soft and nontender.  Bowel sounds were normoactive.  No CVA  region tenderness.  EXTREMITIES:  The patient had some significant difficulty with motion of  her shoulders, but elbows and wrists were full range of motion.  Lower  Extremities:  Right and left hip had full range of motion with flexion,  extension, internal and external rotation.  Right knee had full  extension and flexion back to 120 degrees, no instability, no effusion,  no signs of infection.  Left knee was significantly crepitant with lack  of about 5 or 10 degrees of extension.  She could flex it back to about  100 degrees.  She did have some play with valgus/varus stressing, but no  significant laxity.  The calves were soft and nontender.  The ankles  were  symmetrical with good dorsi and plantarflexion.  PERIPHERAL VASCULAR:  Carotid pulses were 2+, no bruits.  Radial pulses  2+.  Dorsalis pedis pulses were 1+.  She did have some lower extremity  venous varicosities.  She had trace edema in the lower extremities.  NEUROLOGIC:  The patient was conscious, alert and appropriate, a good  historian.  No gross neurologic defects.  BREAST, RECTAL AND GU:  Exams were deferred at this time.   IMPRESSION:  1. End-stage osteoarthritis, left knee, medial compartment, bone on      bone  2. Chronic morphine use.  3. 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  4. Hypertension.   PLAN:  The patient will undergo all routine labs and tests prior to  having a left total knee arthroplasty by Dr. Darrelyn Hillock on August 16, 2006  at [facility].  The patient's questions were answered, all the  pros and cons and possible complications discussed with the patient.  I  did indicate with her that I was concerned about the morphine use; she  says she has been on it over 2 years now.  We did talk about withdrawal  after coming off the narcotics after surgery for with good repairs of  the knee and the possible pain management postop.  The patient is  comfortable with the plans.  She has no other issues.  We will proceed  with a total knee arthroplasty.      Jamelle Rushing, P.A.    ______________________________  Georges Lynch Darrelyn Hillock, M.D.    RWK/MEDQ  D:  08/08/2006  T:  08/09/2006  Job:  [id number]

## 2010-05-28 NOTE — Op Note (Signed)
NAME:  Sheila [name], Sheila [name]                          ACCOUNT NO.:  [id number]   MEDICAL RECORD NO.:  [id number]                   PATIENT TYPE:  AMB   LOCATION:  DAY                                  FACILITY:  [facility]   PHYSICIAN:  Lionel December, M.D.                 DATE OF BIRTH:  [birth year]   DATE OF PROCEDURE:  09/02/2003  DATE OF DISCHARGE:                                 OPERATIVE REPORT   PROCEDURE:  Total colonoscopy with polypectomy.   INDICATIONS:  [name] is a 70 year old Caucasian female who presents with  chronic, nonbloody diarrhea of 4 to 5 months' duration.  Stool studies have  been negative, as has blood work.  She also gives a history of a 45-pound  weight loss this year.  She is undergoing diagnostic colonoscopy.   The procedure and risks were reviewed with the patient and informed consent  was obtained.   PREMEDICATIONS:  Demerol 25 mg IV, Versed 5 mg IV in divided dose.   FINDINGS:  The procedure performed in endoscopy suite, the patient's vital  signs and O2 saturations were monitored during the procedure and remained  stable.  The patient was placed in the left lateral position and rectal  examination performed.  No abnormality noted on external or digital exam.  The Olympus video scope was placed in the rectum and advanced under direct  vision into the sigmoid colon and beyond.  The preparation was satisfactory.  The scope was passed into the cecum which was identified by the appendiceal  orifice and ileocecal valve.  A short segment of TI was also examined and  was normal.  As the scope was withdrawn, colonic mucosa was carefully  examined.  It was normal throughout.  There were two polyps at the  transverse colon.  The smaller one was ablated by cold biopsy and the larger  one that was 6 to 7 mm was snared and retrieved for histologic examination.  There was another 5 to 6-mm polyp at the rectum which was snared and  retrieved for histologic examination.  Random  biopsies were taken from the  sigmoid colon given history of diarrhea.  The rest of the mucosal mucosa was  normal.  The scope was retroflexed to the examine anorectal junction and  small hemorrhoids were noted below the dentate line.  The endoscope was  straightened and withdrawn.  The patient tolerated the procedure well.   FINAL DIAGNOSES:  1. No endoscopic evidence of colitis.  Random biopsies taken from sigmoid     colon given history of chronic diarrhea.  2. Two small polyps snared, one from the transverse colon and another one     from the rectum.  A third polyp from the transverse colon was ablated by     cold biopsy.  3. Small external hemorrhoids.   RECOMMENDATIONS:  1. Standard instructions given.  2. Bentyl 10 mg before each  meal.  3. As discussed with Dr. Milford Cage, she will be scheduled for small-bowel follow-     through because of her diarrhea and weight loss.      ___________________________________________                                            Lionel December, M.D.   NR/MEDQ  D:  09/02/2003  T:  09/02/2003  Job:  [id number]   cc:   Francoise Schaumann. Halm, D.O.  [address], [address]  [place]  [place] [address]  Fax: [phone]

## 2010-05-28 NOTE — Discharge Summary (Signed)
Sheila [name], Sheila [name]                ACCOUNT NO.:  [id number]   MEDICAL RECORD NO.:  [id number]          PATIENT TYPE:  INP   LOCATION:  1534                         FACILITY:  [facility]   PHYSICIAN:  Georges Lynch. Gioffre, M.D.DATE OF BIRTH:  28-Jul-1940   DATE OF ADMISSION:  08/16/2006  DATE OF DISCHARGE:  08/20/2006                               DISCHARGE SUMMARY   ADMISSION DIAGNOSES:  1. End-stage osteoarthritis, left knee, bone on bone.  2. Chronic pain management with morphine.  3. Chronic obstructive pulmonary disease.  4. Tobacco use.  5. Hypertension.   DISCHARGE DIAGNOSES:  1. Left total knee arthroplasty.  2. Postoperative blood loss anemia, correct with correct p.o.      supplements.  3. Chronic pain management with morphine use  4. Chronic obstructive pulmonary disease.  5. History of  tobacco use.  6. Hypertension.   HISTORY OF PRESENT ILLNESS:  The patient is a  70 year old female with  long-term history of severe left knee pain, bone-on-bone on x-rays.  She  has very painful range of motion and weightbearing with activities.  Patient has been on chronic morphine management from a pain management  clinic and elected to proceed with a total knee arthroplasty.   ALLERGIES:  No known drug allergies.   CURRENT MEDICATIONS:  1. Chlorthalidone 25 mg a day.  2. Morphine sulfate 30 mg a day.  3. Indocin.  4. Neurontin 300 mg a day.  5. Potassium.   SURGICAL PROCEDURE:  On August 16, 2006, the patient was taken to the OR  by Dr. Ranee Gosselin, assisted by Oneida Alar, PA-C.  Under general  anesthesia, the patient underwent a left total knee arthroplasty with  Dupuy platform system.  The patient tolerated the procedure well.  There  were no complications.  Minimal blood loss.  The patient had the  following components implanted: A size three left femoral component, a  size three 12.5  bearing, size 3 cemented keel tibial tray, size 35 mm  three-peg patella. All bones were  implanted with polymethyl methacrylate  with vancomycin mixed in.   CONSULTATIONS:  The following routine consults were requested, physical  therapy, case management, pharmacy for Coumadin dosing.   HOSPITAL COURSE:  On August 16, 2006, the patient was admitted to [facility] under the care of Dr. Ranee Gosselin. The patient was  taken to the OR where a left total knee arthroplasty was performed  without any complications.  The patient was transferred to the recovery  room and then to orthopedic floor in good condition on IV pain  medicines, antibiotics and DVT prophylaxis.  The patient is to follow  routine total knee protocol.  Due to the patient's chronic pain  medicines, she did not tolerate her IV pain medicines and needed to  supplement with p.o. medications frequently; so she was transitioned  over to Dilaudid p.o. with Dilaudid PCA, and this improved her pain  control.  She was able to be weaned off the PCA with just Dilaudid p.o.  fairly easily.  The patient's wound remained benign for any signs of  infection.  The leg remained neuromotor and vascularly intact.  The  patient had all of her IV medications discontinued and was able to  tolerate p.o. medications well.  The patient worked well with physical  therapy, and on postop day #4, the patient was comfortable on p.o.  Dilaudid and Robaxin.  She had progressed well with physical therapy.  There were no untoward medical events, so she was discharged home with  outpatient home health physical therapy per routine protocol.   LABORATORY DATA:  CBC on admission found WBC at 7.8, hemoglobin 14.4,  hematocrit 43.6, platelets 402.  Postop hemoglobin and hematocrit August  9 found hemoglobin 10.8, hematocrit 31.1.  INR on date of admission was  1.9.  Routine chemistries on admission found sodium 134, potassium of  3.5, glucose 99, BUN 5, creatinine 0.59 with GFR greater than 60.  Urinalysis preop found epithelial cells and  leukocyte esterase felt to  be possibly contaminate.  She was treated with routine preoperative  antibiotics.   Chest x-ray on admission found no active disease.   DISCHARGE INSTRUCTIONS.:   ACTIVITY:  The patient may increase activity as tolerated with the use  of walker, weightbearing as tolerated.   WOUND CARE:  The patient is to change her dressing daily.   DIET:  No restrictions.   FOLLOW UP:  The patient needs a followup appointment with Dr. Darrelyn Hillock 2  weeks from date of surgery. The patient is to call [phone] for that  appointment.   MEDICATIONS:  1. Gabapentin 300 mg 2 tablets at night.  2. Nortriptyline 25 mg 2 tablets at night.  3. Morphine sulfate ER 30 mg tablets, 1 tablet twice a day.  The      patient is not to continue with this medication  4. Endocet 7.5 mg 1 tablet every 8 hours for pain.  The patient is not      to continue this medication.  5. Chlorthalidone 25 mg once a day.  6. Potassium chloride 10 mEq once a day.  7. Robaxin 500 mg 1 tablet every 6 hours for pain if needed.  8. Coumadin 5 mg once a day unless changed by home health pharmacist.  9. Dilaudid 2 mg tablets 1-3 tablets every 4-6 hours for pain if      needed.   The patient's condition upon discharge to home is listed as improved.      Jamelle Rushing, P.A.    ______________________________  Georges Lynch Darrelyn Hillock, M.D.    RWK/MEDQ  D:  09/05/2006  T:  09/05/2006  Job:  [id number]

## 2010-10-19 LAB — URINALYSIS, ROUTINE W REFLEX MICROSCOPIC
Glucose, UA: NEGATIVE
Hgb urine dipstick: NEGATIVE
Protein, ur: NEGATIVE
pH: 5.5

## 2010-10-19 LAB — DIFFERENTIAL
Basophils Absolute: 0
Basophils Relative: 0
Eosinophils Absolute: 0.2
Eosinophils Relative: 1
Lymphocytes Relative: 18
Monocytes Absolute: 0.6

## 2010-10-19 LAB — COMPREHENSIVE METABOLIC PANEL
AST: 17
Albumin: 4
Alkaline Phosphatase: 88
CO2: 25
Chloride: 97
Creatinine, Ser: 0.86
GFR calc Af Amer: >60
GFR calc non Af Amer: >60
Potassium: 4.1
Total Bilirubin: 1

## 2010-10-19 LAB — TYPE AND SCREEN: DAT, IgG: NEGATIVE

## 2010-10-19 LAB — HEMOGLOBIN AND HEMATOCRIT, BLOOD
HCT: 33.8 — ABNORMAL LOW
Hemoglobin: 11.6 — ABNORMAL LOW
Hemoglobin: 12.6

## 2010-10-19 LAB — CBC
HCT: 42.8
MCV: 91.2
RBC: 4.7
WBC: 11.1 — ABNORMAL HIGH

## 2010-10-19 LAB — PROTIME-INR: Prothrombin Time: 12.5

## 2010-10-19 LAB — PREPARE RBC (CROSSMATCH)

## 2010-10-25 LAB — HEMOGLOBIN AND HEMATOCRIT, BLOOD
HCT: 31.1 — ABNORMAL LOW
HCT: 33 — ABNORMAL LOW
HCT: 34.1 — ABNORMAL LOW
HCT: 37
Hemoglobin: 10.8 — ABNORMAL LOW
Hemoglobin: 12.5

## 2010-10-25 LAB — URINALYSIS, ROUTINE W REFLEX MICROSCOPIC
Bilirubin Urine: NEGATIVE
Nitrite: NEGATIVE
Specific Gravity, Urine: 1.014
Urobilinogen, UA: 1

## 2010-10-25 LAB — DIFFERENTIAL
Eosinophils Relative: 2
Lymphocytes Relative: 23
Lymphs Abs: 1.8
Monocytes Absolute: 0.7

## 2010-10-25 LAB — COMPREHENSIVE METABOLIC PANEL
AST: 25
Albumin: 4
Calcium: 9.5
Chloride: 93 — ABNORMAL LOW
Creatinine, Ser: 0.59
GFR calc Af Amer: >60
Sodium: 134 — ABNORMAL LOW
Total Bilirubin: 0.4

## 2010-10-25 LAB — PROTIME-INR
INR: 1.9 — ABNORMAL HIGH
Prothrombin Time: 22.1 — ABNORMAL HIGH
Prothrombin Time: 22.3 — ABNORMAL HIGH
Prothrombin Time: 27 — ABNORMAL HIGH

## 2010-10-25 LAB — ABO/RH: ABO/RH(D): A POS

## 2010-10-25 LAB — URINE MICROSCOPIC-ADD ON

## 2010-10-25 LAB — TYPE AND SCREEN: ABO/RH(D): A POS

## 2010-10-25 LAB — CBC
MCV: 91.2
Platelets: 402 — ABNORMAL HIGH
WBC: 7.8

## 2012-07-26 ENCOUNTER — Encounter (HOSPITAL_COMMUNITY): Payer: Self-pay

## 2012-07-26 ENCOUNTER — Encounter (HOSPITAL_COMMUNITY): Payer: Medicare Other | Attending: Hematology and Oncology

## 2012-07-26 VITALS — BP 144/81 | HR 78 | Temp 98.2°F | Resp 18 | Ht 60.0 in | Wt 156.5 lb

## 2012-07-26 DIAGNOSIS — D472 Monoclonal gammopathy: Secondary | ICD-10-CM

## 2012-07-26 DIAGNOSIS — Z09 Encounter for follow-up examination after completed treatment for conditions other than malignant neoplasm: Secondary | ICD-10-CM | POA: Insufficient documentation

## 2012-07-26 LAB — COMPREHENSIVE METABOLIC PANEL
ALT: 10 U/L (ref 0–35)
CO2: 31 mEq/L (ref 19–32)
Calcium: 9.8 mg/dL (ref 8.4–10.5)
Creatinine, Ser: 0.72 mg/dL (ref 0.50–1.10)
GFR calc Af Amer: >90 mL/min (ref >90)
GFR calc non Af Amer: 84 mL/min — ABNORMAL LOW (ref >90)
Glucose, Bld: 84 mg/dL (ref 70–99)

## 2012-07-26 LAB — CBC WITH DIFFERENTIAL/PLATELET
Eosinophils Absolute: 0.2 10*3/uL (ref 0.0–0.7)
Eosinophils Relative: 3 % (ref 0–5)
Hemoglobin: 14.1 g/dL (ref 12.0–15.0)
Lymphs Abs: 2.1 10*3/uL (ref 0.7–4.0)
MCH: 31 pg (ref 26.0–34.0)
MCV: 91.2 fL (ref 78.0–100.0)
Monocytes Relative: 10 % (ref 3–12)
RBC: 4.55 MIL/uL (ref 3.87–5.11)

## 2012-07-26 NOTE — Progress Notes (Deleted)
Patient History and Physical   Sheila [name] [id number] [birth year] 72 y.o. 07/26/2012  Referring MD:***  Chief Complaint: ***   HPI:  ***   PMH: No past medical history on file.  No past surgical history on file.  Allergies: Allergies not on file  Medications: No current outpatient prescriptions on file.   Social History:   has no tobacco, alcohol, and drug history on file.  Family History: No family history on file.  Review of Systems: ***   Physical Exam: There were no vitals taken for this visit. GENERAL: No distress, well nourished.  SKIN:  No rashes or significant lesions  HEAD: Normocephalic, No masses, lesions, tenderness or abnormalities  EYES: Conjunctiva are pink and non-injected  ENT: External ears normal ,lips, buccal mucosa, and tongue normal and mucous membranes are moist  LYMPH: No palpable lymphadenopathy,  BREAST:Normal without mass, skin or nipple changes or axillary nodes,  LUNGS: clear to auscultation , no crackles or wheezes HEART: regular rate & rhythm, no murmurs, no gallops, S1 normal and S2 normal  ABDOMEN: Abdomen soft, non-tender, normal bowel sounds, no masses or organomegaly and no hepatosplenomegaly  MSK: No CVA tenderness and no tenderness on percussion of the back or rib cage. EXTREMITIES: No edema, no skin discoloration or tenderness NEURO: alert & oriented , no focal motor/sensory deficits.     Lab Results: Lab Results  Component Value Date   WBC 11.0* 02/26/2010   HGB 13.1 02/26/2010   HCT 39.3 02/26/2010   MCV 90.1 02/26/2010   PLT 309 02/26/2010     Chemistry      Component Value Date/Time   NA 137 02/26/2010 1344   K 4.6 02/26/2010 1344   CL 97 02/26/2010 1344   CO2 30 02/26/2010 1344   BUN 9 02/26/2010 1344   CREATININE 0.84 02/26/2010 1344      Component Value Date/Time   CALCIUM 10.0 02/26/2010 1344   ALKPHOS 88 12/04/2006 1120   AST 17 12/04/2006 1120   ALT 13 12/04/2006 1120   BILITOT 1.0 12/04/2006 1120          Radiological Studies: No results found.    Impression: ***  Recommendations: ***     All questions were satisfactorily answered.She knows to call if she has any concern.  I spent {CHL ONC TIME VISIT - WUJWJ:[id number]} counseling the patient face to face. The total time spent in the appointment was {CHL ONC TIME VISIT - OZHYQ:[id number]}.   Sherral Hammers, MD FACP. Hematology/Oncology.     Marland Kitchen

## 2012-07-26 NOTE — Progress Notes (Signed)
Patient History and Physical   Sheila [name] [id number] 10-12-1940 72 y.o. 07/26/2012  Referring MD: Beryle Beams, MD.  Chief Complaint: [facility]  HPI: States that she was told last week about the need for this visit. I have reviewed records from Dr Ronal Fear office,serum protein electrophoresis was done on 05/24/2012 which showed no M spike, but immunofixation showed an IgM monoclonal pattern with Kappa light chain restriction. Also noted was slightly decreased IgG of 512 mg /dL and normal IgM was 409 mg /dL with total protein was 6.6 g/dL and Albumin was 4.2 g/dL.  Patient does not feel any different from her usual baseline and has multiple musculoskeletal complaints and problems. Denies any personal history of hematologic or cancer condition. Multiple comorbidites listed below noted. Profoundly patient has multiple pain problems all of  which are long-standing.  Patient was accompanied by sister-in-law Dwana Curd.   PMH: Past Medical History  Diagnosis Date  . Fibromyalgia   . Osteoarthritis   . Restless leg syndrome   . IBS (irritable bowel syndrome)   . Hypertension   . DDD (degenerative disc disease)   . Scoliosis   . COPD (chronic obstructive pulmonary disease)     Past Surgical History  Procedure Laterality Date  . Total shoulder replacement Right     x2  . Total shoulder replacement Left   . Replacement total knee Left   . Back surgery    . Cholecystectomy      Allergies: Not on File  Medications: Current outpatient prescriptions:Ascorbic Acid (VITAMIN C PO), Take 1 capsule by mouth daily., Disp: , Rfl: ;  Cholecalciferol (VITAMIN D PO), Take 1 capsule by mouth daily., Disp: , Rfl: ;  cyclobenzaprine (FLEXERIL) 10 MG tablet, Take 10 mg by mouth 2 (two) times daily., Disp: , Rfl: ;  diclofenac sodium (VOLTAREN) 1 % GEL, Apply 1 application topically as needed., Disp: , Rfl:  DULoxetine (CYMBALTA) 60 MG capsule, Take 60 mg by mouth daily., Disp: , Rfl: ;   furosemide (LASIX) 20 MG tablet, Take 20 mg by mouth as needed for edema., Disp: , Rfl: ;  gabapentin (NEURONTIN) 300 MG capsule, Take 300 mg by mouth 3 (three) times daily., Disp: , Rfl: ;  ibuprofen (ADVIL,MOTRIN) 600 MG tablet, Take 600 mg by mouth every 8 (eight) hours as needed for pain., Disp: , Rfl:  metoprolol succinate (TOPROL-XL) 50 MG 24 hr tablet, Take 50 mg by mouth daily. Take with or immediately following a meal., Disp: , Rfl: ;  Omega-3 Fatty Acids (FISH OIL PO), Take 1 capsule by mouth 2 (two) times daily., Disp: , Rfl: ;  oxyCODONE-acetaminophen (PERCOCET) 7.5-325 MG per tablet, Take 1 tablet by mouth every 6 (six) hours as needed for pain., Disp: , Rfl:  temazepam (RESTORIL) 15 MG capsule, Take 15 mg by mouth at bedtime as needed for sleep., Disp: , Rfl: ;  VITAMIN E PO, Take 1 capsule by mouth daily., Disp: , Rfl:    Social History:   reports that she has been smoking Cigarettes.  She has a 22.5 pack-year smoking history. She has never used smokeless tobacco. She reports that she does not drink alcohol or use illicit drugs. Still smokes 3/4 of a pack per day. Married and lives with spouse.  Used to work in [employer].  Family History: Family History  Problem Relation Age of Onset  . Cancer Mother   . Cancer Brother   . Diabetes Brother   . Cancer Brother    3 Brothers  died of lung cancers. Mother died of "cancer behind her gallbladder'"  Review of Systems:  Chronic msk pain related to multiple skeletal problems and   back pain. No significant weight loss. No change in SOB. Uses O2 at night. No fever or chills. Eating well. Denies night sweats. Some fatigue more than before. 14 point review of system is as in the history above otherwise negative.   Physical Exam: Blood pressure 144/81, pulse 78, temperature 98.2 F (36.8 C), temperature source Oral, resp. rate 18, height 5' (1.524 m), weight 156 lb 8 oz (70.988 kg). GENERAL: No distress.  SKIN:  No rashes or  significant lesions.  Some mild bruising in lower extremity.  HEAD: Normocephalic, No masses, lesions, tenderness or abnormalities  EYES: Conjunctiva are pink and non-injected  ENT: External ears normal ,lips, buccal mucosa, and tongue normal and mucous membranes are moist  LYMPH: No palpable lymphadenopathy, in the neck, supraclavicular, axillary or inguinal lymph node areas. LUNGS: clear to auscultation , no crackles or wheezes HEART: regular rate & rhythm, no murmurs, no gallops, S1 normal and S2 normal  ABDOMEN: Abdomen soft, non-tender, normal bowel sounds, no masses or organomegaly and no hepatosplenomegaly palpable. MSK: kyphoscoliosis. EXTREMITIES: No edema, no skin discoloration or tenderness NEURO: alert & oriented , no focal motor/sensory deficits noted on exam.     Lab Results: Lab Results  Component Value Date   WBC 11.0* 02/26/2010   HGB 13.1 02/26/2010   HCT 39.3 02/26/2010   MCV 90.1 02/26/2010   PLT 309 02/26/2010     Chemistry      Component Value Date/Time   NA 137 02/26/2010 1344   K 4.6 02/26/2010 1344   CL 97 02/26/2010 1344   CO2 30 02/26/2010 1344   BUN 9 02/26/2010 1344   CREATININE 0.84 02/26/2010 1344      Component Value Date/Time   CALCIUM 10.0 02/26/2010 1344   ALKPHOS 88 12/04/2006 1120   AST 17 12/04/2006 1120   ALT 13 12/04/2006 1120   BILITOT 1.0 12/04/2006 1120         Radiological Studies: No results found.    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  I will like to repeat myeloma panel to confirm this.  I will also get a CBC and CMP looking for  CRAB Features, however I doubt that multiple myeloma will be the case here. Multiple medical comorbidities noted.  Patient is also a smoker.  Recommendations: 1.  Myeloma panel was ordered with light chain assay. 2.  CBC CMP ordered. 3.  She'll return to clinic in 2 weeks to discuss the results. 4.  Smoking cessation counseling was also  provided.     All questions were satisfactorily answered.She knows to call if she has any concern.  I spent more than 50% of 45 minutes total time in counseling the patient face to face coordination of care.   Thank you Dr. Gerilyn Pilgrim for this referral.    Sherral Hammers, MD FACP. Hematology/Oncology.     Marland Kitchen

## 2012-07-26 NOTE — Patient Instructions (Addendum)
[facility] Discharge Instructions  RECOMMENDATIONS MADE BY THE CONSULTANT AND ANY TEST RESULTS WILL BE SENT TO YOUR REFERRING PHYSICIAN.  EXAM FINDINGS BY THE PHYSICIAN TODAY AND SIGNS OR SYMPTOMS TO REPORT TO CLINIC OR PRIMARY PHYSICIAN:   Dr. Cheral Marker suspicion for this being Multiple Myeloma are very low. We will not know for certain until your lab tests come back.   We want you to return in 2 weeks to review your lab tests with Dr. Beckie Busing.   If this is a cancer we suspect that it will be Multiple Myeloma. I am giving you a handout about Multiple Myeloma to go along with your discharge instructions.    Thank you for choosing [facility] to provide your oncology and hematology care.  To afford each patient quality time with our providers, please arrive at least 15 minutes before your scheduled appointment time.  With your help, our goal is to use those 15 minutes to complete the necessary work-up to ensure our physicians have the information they need to help with your evaluation and healthcare recommendations.    Effective January 1st, 2014, we ask that you re-schedule your appointment with our physicians should you arrive 10 or more minutes late for your appointment.  We strive to give you quality time with our providers, and arriving late affects you and other patients whose appointments are after yours.    Again, thank you for choosing [facility].  Our hope is that these requests will decrease the amount of time that you wait before being seen by our physicians.       _____________________________________________________________  Should you have questions after your visit to [facility], please contact our office at [phone] between the hours of 8:30 a.m. and 5:00 p.m.  Voicemails left after 4:30 p.m. will not be returned until the following business day.  For prescription refill requests, have your pharmacy contact our office  with your prescription refill request.

## 2012-07-30 LAB — MULTIPLE MYELOMA PANEL, SERUM
Albumin ELP: 66.3 % — ABNORMAL HIGH (ref 55.8–66.1)
Alpha-1-Globulin: 4.2 % (ref 2.9–4.9)
Gamma Globulin: 8.5 % — ABNORMAL LOW (ref 11.1–18.8)
IgG (Immunoglobin G), Serum: 556 mg/dL — ABNORMAL LOW (ref 690–1700)

## 2012-08-09 ENCOUNTER — Encounter (HOSPITAL_BASED_OUTPATIENT_CLINIC_OR_DEPARTMENT_OTHER): Payer: Medicare Other

## 2012-08-09 ENCOUNTER — Encounter (HOSPITAL_COMMUNITY): Payer: Self-pay

## 2012-08-09 VITALS — BP 127/78 | HR 70 | Temp 98.3°F | Resp 16 | Wt 157.5 lb

## 2012-08-09 DIAGNOSIS — R778 Other specified abnormalities of plasma proteins: Secondary | ICD-10-CM

## 2012-08-09 DIAGNOSIS — R799 Abnormal finding of blood chemistry, unspecified: Secondary | ICD-10-CM

## 2012-08-09 NOTE — Patient Instructions (Addendum)
[facility] Discharge Instructions  RECOMMENDATIONS MADE BY THE CONSULTANT AND ANY TEST RESULTS WILL BE SENT TO YOUR REFERRING PHYSICIAN.  EXAM FINDINGS BY THE PHYSICIAN TODAY AND SIGNS OR SYMPTOMS TO REPORT TO CLINIC OR PRIMARY PHYSICIAN: Discussion about test results by Dr. Sharia Reeve.  Your blood work was ok.  We will recheck everything in 6 months.  MEDICATIONS PRESCRIBED:  none     SPECIAL INSTRUCTIONS/FOLLOW-UP: Follow-up with blood work and MD visit in 6 months.  If blood work is stable we will probably release you back to your primary care physician.  Thank you for choosing [facility] to provide your oncology and hematology care.  To afford each patient quality time with our providers, please arrive at least 15 minutes before your scheduled appointment time.  With your help, our goal is to use those 15 minutes to complete the necessary work-up to ensure our physicians have the information they need to help with your evaluation and healthcare recommendations.    Effective January 1st, 2014, we ask that you re-schedule your appointment with our physicians should you arrive 10 or more minutes late for your appointment.  We strive to give you quality time with our providers, and arriving late affects you and other patients whose appointments are after yours.    Again, thank you for choosing [facility].  Our hope is that these requests will decrease the amount of time that you wait before being seen by our physicians.       _____________________________________________________________  Should you have questions after your visit to [facility], please contact our office at [phone] between the hours of 8:30 a.m. and 5:00 p.m.  Voicemails left after 4:30 p.m. will not be returned until the following business day.  For prescription refill requests, have your pharmacy contact our office with your prescription refill request.

## 2012-08-09 NOTE — Progress Notes (Signed)
      [facility] Telephone:([phone]) [phone]   Fax:([phone]) [phone]  OFFICE PROGRESS NOTE  No primary provider on file. No primary provider on file.  DIAGNOSIS: Abnormal serum protein electrophoresis.  Negative for marked monoclonal gammopathy  INTERVAL HISTORY:   Sheila [name] 72 y.o. female returns to the clinic today for scheduled follow up review of lab tests done for abnormal serum protein electrophoresis. Please recall that  Patient was seen previously on 07/26/2012. Repeat serum protein electrophoresis did not identify any M spike, immunoglobulin were essentially normal except for mildly decreased IgG of 556, light chains where normal and k/l ratio also normal. However mention was made of " the possibility of a faint restricted band(s) cannot be completely excluded in the gamma region". Complete metabolic profile did not reveal any renal impairment at CBC showed normal counts.  REVIEW OF SYSTEMS: As above.  PHYSICAL EXAMINATION:  Blood pressure 127/78, pulse 70, temperature 98.3 F (36.8 C), temperature source Oral, resp. rate 16, weight 157 lb 8 oz (71.442 kg). GENERAL: No acute distress. NEURO: Alert & oriented      LABORATORY DATA: Lab Results  Component Value Date   WBC 7.0 07/26/2012   HGB 14.1 07/26/2012   HCT 41.5 07/26/2012   MCV 91.2 07/26/2012   PLT 283 07/26/2012      Chemistry      Component Value Date/Time   NA 132* 07/26/2012 1436   K 4.2 07/26/2012 1436   CL 95* 07/26/2012 1436   CO2 31 07/26/2012 1436   BUN 10 07/26/2012 1436   CREATININE 0.72 07/26/2012 1436      Component Value Date/Time   CALCIUM 9.8 07/26/2012 1436   ALKPHOS 97 07/26/2012 1436   AST 20 07/26/2012 1436   ALT 10 07/26/2012 1436   BILITOT 0.3 07/26/2012 1436      ASSESSMENT:  Repeat serum protein electrophoresis does not confirm monoclonal gammopathy.  However I think that given previously abnormal results repeat in 6 months would be reasonable.  If repeat at that time is  unremarkable, then this could be discontinued. The odds are more likely the abnormality mentioned in the gamma region is likely an artifact.   PLAN:  1. Return to clinic in 6 months for repeat myeloma panel. 2. Follow up with other providers for routine care.     All questions were satisfactorily answered. Patient knows to call if  any concern arises.  I spent more than 50 % counseling the patient face to face. The total time spent in the appointment was 30 minutes.   Sherral Hammers, MD FACP. Hematology/Oncology.

## 2012-08-15 ENCOUNTER — Encounter: Payer: Self-pay | Admitting: Hematology and Oncology

## 2013-01-15 ENCOUNTER — Telehealth: Payer: Self-pay

## 2013-01-15 NOTE — Telephone Encounter (Signed)
Patient past away per [employer] in [place] [employer] °

## 2013-02-01 ENCOUNTER — Other Ambulatory Visit (HOSPITAL_COMMUNITY): Payer: Medicare Other

## 2013-02-06 ENCOUNTER — Other Ambulatory Visit (HOSPITAL_COMMUNITY): Payer: Medicare Other

## 2013-02-08 ENCOUNTER — Ambulatory Visit (HOSPITAL_COMMUNITY): Payer: Medicare Other

## 2013-02-10 DEATH — deceased
